# Patient Record
Sex: Male | Born: 1995 | Race: White | Hispanic: No | Marital: Single | Smoking: Never smoker
Health system: Southern US, Community
[De-identification: ages and names within clinical notes are randomized; demographics above are authoritative.]

---
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
## 2014-11-02 ENCOUNTER — Other Ambulatory Visit (HOSPITAL_COMMUNITY): Payer: Self-pay | Admitting: Physician Assistant

## 2014-11-03 ENCOUNTER — Other Ambulatory Visit (HOSPITAL_COMMUNITY): Payer: Self-pay | Admitting: Physician Assistant

## 2014-11-03 DIAGNOSIS — R109 Unspecified abdominal pain: Secondary | ICD-10-CM

## 2014-11-08 ENCOUNTER — Ambulatory Visit (HOSPITAL_COMMUNITY): Admission: RE | Admit: 2014-11-08 | Payer: PRIVATE HEALTH INSURANCE | Source: Ambulatory Visit

## 2016-03-26 DIAGNOSIS — R002 Palpitations: Secondary | ICD-10-CM | POA: Insufficient documentation

## 2016-03-27 ENCOUNTER — Encounter: Payer: Self-pay | Admitting: Cardiology

## 2016-03-27 ENCOUNTER — Ambulatory Visit (INDEPENDENT_AMBULATORY_CARE_PROVIDER_SITE_OTHER): Payer: BLUE CROSS/BLUE SHIELD | Admitting: Cardiology

## 2016-03-27 VITALS — BP 122/72 | HR 79 | Ht 69.0 in | Wt 174.0 lb

## 2016-03-27 DIAGNOSIS — R002 Palpitations: Secondary | ICD-10-CM

## 2016-03-27 DIAGNOSIS — I498 Other specified cardiac arrhythmias: Secondary | ICD-10-CM

## 2016-03-27 NOTE — Progress Notes (Signed)
     Clinical Summary Kenneth Le is a 21 y.o.male seen as a new patient, referred by Dr Phillips OdorGolding for the following medical problemNeldon Le.   1. Palpitations - started about 6 months ago. Feeling of heart racing, no other associated symptoms. Can occur at rest or with activity.   - can last up to 30 minutes - variable frequency, can occur sporadically - no coffee, no sodas, occasional tea, no energy drinks, 1-2 beers a week, occasional red wine. - otherwise active, no significant SOB or DOE - heart rate up to 180 by apple watch by previous checks.      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    No Known Allergies   Current Outpatient Prescriptions  Medication Sig Dispense Refill  . Prenatal Vit-Fe Fumarate-FA (M-VIT) tablet Take 1 tablet by mouth daily.     No current facility-administered medications for this visit.         No Known Allergies    Family history: father with CAD and CABG (age 2450s), maternal grandfather MI    Social History Kenneth Le reports that he has never smoked. He has never used smokeless tobacco. Kenneth Le has no alcohol history on file.   Review of Systems CONSTITUTIONAL: No weight loss, fever, chills, weakness or fatigue.  HEENT: Eyes: No visual loss, blurred vision, double vision or yellow sclerae.No hearing loss, sneezing, congestion, runny nose or sore throat.  SKIN: No rash or itching.  CARDIOVASCULAR: per HPI RESPIRATORY: No shortness of breath, cough or sputum.  GASTROINTESTINAL: No anorexia, nausea, vomiting or diarrhea. No abdominal pain or blood.  GENITOURINARY: No burning on urination, no polyuria NEUROLOGICAL: No headache, dizziness, syncope, paralysis, ataxia, numbness or tingling in the extremities. No change in bowel or bladder control.  MUSCULOSKELETAL: No muscle, back pain, joint pain or stiffness.  LYMPHATICS: No enlarged nodes. No history of splenectomy.    PSYCHIATRIC: No history of depression or anxiety.  ENDOCRINOLOGIC: No reports of sweating, cold or heat intolerance. No polyuria or polydipsia.  Marland Kitchen.   Physical Examination Vitals:   03/27/16 1438 03/27/16 1443  BP: 121/70 122/72  Pulse: 79    Vitals:   03/27/16 1438  Weight: 174 lb (78.9 kg)  Height: 5\' 9"  (1.753 m)    Gen: resting comfortably, no acute distress HEENT: no scleral icterus, pupils equal round and reactive, no palptable cervical adenopathy,  CV: RRR, no m/r/g, no jvd Resp: Clear to auscultation bilaterally GI: abdomen is soft, non-tender, non-distended, normal bowel sounds, no hepatosplenomegaly MSK: extremities are warm, no edema.  Skin: warm, no rash Neuro:  no focal deficits Psych: appropriate affect     Assessment and Plan  1. Palpitations - baseline EKG shows NSR - we will obtain 3 week event monitor to further evaluate - request labs from pcp, if no TSH done will need to order    F/u 3 months with PA      Antoine PocheJonathan F. Alvia Tory, M.D

## 2016-03-27 NOTE — Patient Instructions (Signed)
Medication Instructions:  Your physician recommends that you continue on your current medications as directed. Please refer to the Current Medication list given to you today.   Labwork: I WILL REQUEST A COPY OF LABS FROM PCP  Testing/Procedures: Your physician has recommended that you wear an event monitor. Event monitors are medical devices that record the heart's electrical activity. Doctors most often us these monitors to diagnose arrhythmias. Arrhythmias are problems with the speed or rhythm of the heartbeat. The monitor is a small, portable device. You can wear one while you do your normal daily activities. This is usually used to diagnose what is causing palpitations/syncope (passing out). * 21 DAY   Follow-Up: Your physician recommends that you schedule a follow-up appointment in: 1 MONTH   Any Other Special Instructions Will Be Listed Below (If Applicable).     If you need a refill on your cardiac medications before your next appointment, please call your pharmacy.

## 2016-03-28 ENCOUNTER — Encounter: Payer: Self-pay | Admitting: Cardiology

## 2016-04-03 ENCOUNTER — Ambulatory Visit (INDEPENDENT_AMBULATORY_CARE_PROVIDER_SITE_OTHER): Payer: BLUE CROSS/BLUE SHIELD

## 2016-04-03 DIAGNOSIS — R002 Palpitations: Secondary | ICD-10-CM | POA: Diagnosis not present

## 2016-04-28 NOTE — Progress Notes (Signed)
Cardiology Office Note   Date:  04/29/2016   ID:  Kenneth Le, DOB 06-13-1995, MRN [id number]  PCP:  Colette Ribas, MD  Cardiologist: Arlington Calix, NP   Chief Complaint  Patient presents with  . Palpitations      History of Present Illness: Kenneth Le is a 21 y.o. male who presents for ongoing assessment and management of palpitations. Was last seen by Dr. Wyline Mood on 03/27/2016. A 3 week Holter monitor was placed to evaluate further and TSH was ordered.. Results of monitor.   Study Highlights    Based on monitor results the device obtained data only 33% of the planned monitored time, with 8% being artifact and nondiagnostic. Would confirm with patient if issues with monitor  Min HR 57, Max HR 158, Avg HR 81  Available tracings show sinus rhythm and sinus tachycardia  Reported symptoms correlated with normal sinus rhythm and mild sinus tachcardia  No significant arrhythmias   TSH was completed by PCP. Patient reports it was normal.  He comes today feeling some better. He states he has rapid HR sometimes when he bends over to pick something up.  On further questioning, he drinks caffeine during the day and uses pseudoephedrine a few times a week for sinus headache with tylenol.   History reviewed. No pertinent past medical history.  History reviewed. No pertinent surgical history.   Current Outpatient Prescriptions  Medication Sig Dispense Refill  . Prenatal Vit-Fe Fumarate-FA (M-VIT) tablet Take 1 tablet by mouth daily.     No current facility-administered medications for this visit.     Allergies:   Patient has no known allergies.    Social History:  The patient  reports that he has never smoked. He has never used smokeless tobacco. He reports that he drinks alcohol. He reports that he does not use drugs.   Family History:  The patient's family history includes AAA (abdominal aortic aneurysm) in his mother; Heart attack in his father,  maternal grandfather, and maternal grandmother.    ROS: All other systems are reviewed and negative. Unless otherwise mentioned in H&P    PHYSICAL EXAM: VS:  BP 106/70   Pulse 86   Ht  (1.778 m)   Wt 174 lb (78.9 kg)   SpO2 96%   BMI 24.97 kg/m  , BMI Body mass index is 24.97 kg/m. GEN: Well nourished, well developed, in no acute distress  HEENT: normal  Neck: no JVD, carotid bruits, or masses Cardiac: RRR; no murmurs, rubs, or gallops,no edema  Respiratory:  clear to auscultation bilaterally, normal work of breathing GI: soft, nontender, nondistended, + BS MS: no deformity or atrophy  Skin: warm and dry, no rash Neuro:  Strength and sensation are intact Psych: euthymic mood, full affect  Recent Labs: No results found for requested labs within last 8760 hours.    Lipid Panel No results found for: CHOL, TRIG, HDL, CHOLHDL, VLDL, LDLCALC, LDLDIRECT    Wt Readings from Last 3 Encounters:  04/29/16 174 lb (78.9 kg)  03/27/16 174 lb (78.9 kg)     ASSESSMENT AND PLAN:  1. Palpitations: I have reviewed the cardiac monitor results with the patient. He verbalizes understanding. He is recommended not to take pseudoephedrine products, decrease caffeine, and stay hydrated. He will see [place] prn. No further cardiac testing at this time.    Current medicines are reviewed at length with the patient today.    Labs/ tests ordered today include:  No orders  of the defined types were placed in this encounter.    Disposition:   FU with PRN  Signed, Joni Reining, NP  04/29/2016 3:55 PM    [facility] [address], [place], [place] [address] Phone: [phone]; Fax: [phone]

## 2016-04-29 ENCOUNTER — Ambulatory Visit (INDEPENDENT_AMBULATORY_CARE_PROVIDER_SITE_OTHER): Payer: BLUE CROSS/BLUE SHIELD | Admitting: Adult Health

## 2016-04-29 ENCOUNTER — Encounter: Payer: Self-pay | Admitting: Adult Health

## 2016-04-29 VITALS — BP 106/70 | HR 86 | Ht 70.0 in | Wt 174.0 lb

## 2016-04-29 DIAGNOSIS — I498 Other specified cardiac arrhythmias: Secondary | ICD-10-CM

## 2016-04-29 NOTE — Patient Instructions (Signed)
Your physician recommends that you schedule a follow-up appointment as needed.   Your physician recommends that you continue on your current medications as directed. Please refer to the Current Medication list given to you today.  If you need a refill on your cardiac medications before your next appointment, please call your pharmacy.  Thank you for choosing [facility]!

## 2016-04-29 NOTE — Progress Notes (Signed)
Name: Kenneth Le    DOB: 16-Nov-1995  Age: 21 y.o.  MR#: [id number]       PCP:  Colette Ribas, MD      Insurance: Payor: BLUE CROSS BLUE SHIELD / Plan: BCBS OTHER / Product Type: *No Product type* /   CC:   No chief complaint on file.   VS Vitals:   04/29/16 1437  Pulse: 86  SpO2: 96%  Weight: 174 lb (78.9 kg)  Height:  (1.778 m)    Weights Current Weight  04/29/16 174 lb (78.9 kg)  03/27/16 174 lb (78.9 kg)    Blood Pressure  BP Readings from Last 3 Encounters:  03/27/16 122/72     Admit date:  (Not on file) Last encounter with RMR:  Visit date not found   Allergy Patient has no known allergies.  Current Outpatient Prescriptions  Medication Sig Dispense Refill  . Prenatal Vit-Fe Fumarate-FA (M-VIT) tablet Take 1 tablet by mouth daily.     No current facility-administered medications for this visit.     Discontinued Meds:   There are no discontinued medications.  Patient Active Problem List   Diagnosis Date Noted  . Fluttering sensation of heart 03/26/2016    LABS No results found for: NA, K, CL, CO2, GLUCOSE, BUN, CREATININE, CALCIUM, GFRNONAA, GFRAA CMP  No results found for: NA, K, CL, CO2, GLUCOSE, BUN, CREATININE, CALCIUM, PROT, ALBUMIN, AST, ALT, ALKPHOS, BILITOT, GFRNONAA, GFRAA  No results found for: WBC, HGB, HCT, MCV  Lipid Panel  No results found for: CHOL, TRIG, HDL, CHOLHDL, VLDL, LDLCALC, LDLDIRECT  ABG No results found for: PHART, PCO2ART, PO2ART, HCO3, TCO2, ACIDBASEDEF, O2SAT   No results found for: TSH BNP (last 3 results) No results for input(s): BNP in the last 8760 hours.  ProBNP (last 3 results) No results for input(s): PROBNP in the last 8760 hours.  Cardiac Panel (last 3 results) No results for input(s): CKTOTAL, CKMB, TROPONINI, RELINDX in the last 72 hours.  Iron/TIBC/Ferritin/ %Sat No results found for: IRON, TIBC, FERRITIN, IRONPCTSAT   EKG Orders placed or performed in visit on 03/27/16  . EKG 12-Lead      Prior Assessment and Plan Problem List as of 04/29/2016 Reviewed: 03/28/2016 10:31 AM by Dina Rich, MD     Other   Fluttering sensation of heart       Imaging: No results found.

## 2019-12-03 ENCOUNTER — Telehealth: Payer: HRSA Program | Admitting: Nurse Practitioner

## 2019-12-03 DIAGNOSIS — U071 COVID-19: Secondary | ICD-10-CM | POA: Diagnosis not present

## 2019-12-03 NOTE — Progress Notes (Signed)
E-Visit for Corona Virus Screening  We are sorry you are not feeling well. We are here to help!  You have tested positive for COVID-19, meaning that you were infected with the novel coronavirus and could give the virus to others.  It is vitally important that you stay home so you do not spread it to others.      Please continue isolation at home, for at least 10 days since the start of your symptoms and until you have had 24 hours with no fever (without taking a fever reducer) and with improving of symptoms.  Most cases improve 10 days from onset but we have seen a small number of patients who have gotten worse after the 10 days.  Please be sure to watch for worsening symptoms and remain taking the proper precautions.   Go to the nearest hospital ED for assessment if fever/cough/breathlessness are severe or illness seems like a threat to life.    The following symptoms may appear 2-14 days after exposure: . Fever . Cough . Shortness of breath or difficulty breathing . Chills . Repeated shaking with chills . Muscle pain . Headache . Sore throat . New loss of taste or smell . Fatigue . Congestion or runny nose . Nausea or vomiting . Diarrhea  You have been enrolled in [facility] Monitoring for COVID-19. Daily you will receive a questionnaire within the MyChart website. Our COVID-19 response team will be monitoring your responses daily.  You can use medication such as A prescription cough medication called Tessalon Perles 100 mg. You may take 1-2 capsules every 8 hours as needed for cough and A prescription inhaler called Albuterol MDI 90 mcg /actuation 2 puffs every 4 hours as needed for shortness of breath, wheezing, cough  You have tested positive for Covid but because you are not considered high risk you do not qualify for monoclonal antibody infusion.  Supportive care is all that is needed.   You may also take acetaminophen (Tylenol) as needed for fever.  HOME CARE: . Only  take medications as instructed by your medical team. . Drink plenty of fluids and get plenty of rest. . A steam or ultrasonic humidifier can help if you have congestion.   GET HELP RIGHT AWAY IF YOU HAVE EMERGENCY WARNING SIGNS.  Call 911 or proceed to your closest emergency facility if: . You develop worsening high fever. . Trouble breathing . Bluish lips or face . Persistent pain or pressure in the chest . New confusion . Inability to wake or stay awake . You cough up blood. . Your symptoms become more severe . Inability to hold down food or fluids  This list is not all possible symptoms. Contact your medical provider for any symptoms that are severe or concerning to you.    Your e-visit answers were reviewed by a board certified advanced clinical practitioner to complete your personal care plan.  Depending on the condition, your plan could have included both over the counter or prescription medications.  If there is a problem please reply once you have received a response from your provider.  Your safety is important to [place].  If you have drug allergies check your prescription carefully.    You can use MyChart to ask questions about today's visit, request a non-urgent call back, or ask for a work or school excuse for 24 hours related to this e-Visit. If it has been greater than 24 hours you will need to follow up with your provider, or  enter a new e-Visit to address those concerns. You will get an e-mail in the next two days asking about your experience.  I hope that your e-visit has been valuable and will speed your recovery. Thank you for using e-visits.      Greater than 5 minutes, yet less than 10 minutes of time have been spent researching, coordinating and implementing care for this patient today.

## 2019-12-04 ENCOUNTER — Telehealth: Payer: Self-pay | Admitting: Adult Health

## 2019-12-04 ENCOUNTER — Telehealth (HOSPITAL_COMMUNITY): Payer: Self-pay

## 2019-12-04 NOTE — Telephone Encounter (Signed)
Called patient to pre-screen for monoclonal antibody infusion after receiving recent positive test. Patient qualifies based off off co-morbid condition and/or member of an at risk group. Symptom onset 11/15, fatigue, chest tightness, congestion.   Patient Active Problem List   Diagnosis Date Noted  . Fluttering sensation of heart 03/26/2016    Patient is interested in learning more about the infusion. RN forwarded information to APP's for additional screening/scheduling.   Veera Stapleton Loyola Mast, RN

## 2019-12-04 NOTE — Telephone Encounter (Signed)
Called patient regarding monoclonal antibody treatment for COVID 19 given to those who are at risk for complications and/or hospitalization of the virus.  Patient meets criteria based on: second hand smoke exposure.  After discussion of risk/benefits, he would like to think about it.  I recommended he call us back by 11/22 in the morning if he wants to receive treatment.  Sx onset 11/29/2019.  Call back number given: [phone]   Lillard Anes, NP
